# Patient Record
Sex: Male | Born: 1985 | Race: Asian | Hispanic: Yes | Marital: Single
Health system: Southern US, Community
[De-identification: ages and names within clinical notes are randomized; demographics above are authoritative.]

---

## 2019-04-11 ENCOUNTER — Emergency Department (HOSPITAL_COMMUNITY)
Admission: EM | Admit: 2019-04-11 | Discharge: 2019-04-11 | Disposition: A | Discharge status: Home / Self Care | Payer: Self-pay | Attending: Emergency Medicine | Admitting: Emergency Medicine

## 2019-04-11 ENCOUNTER — Encounter (HOSPITAL_COMMUNITY): Payer: Self-pay | Admitting: Emergency Medicine

## 2019-04-11 ENCOUNTER — Emergency Department (HOSPITAL_COMMUNITY): Payer: Self-pay

## 2019-04-11 ENCOUNTER — Other Ambulatory Visit: Payer: Self-pay

## 2019-04-11 DIAGNOSIS — N201 Calculus of ureter: Secondary | ICD-10-CM | POA: Insufficient documentation

## 2019-04-11 DIAGNOSIS — R109 Unspecified abdominal pain: Secondary | ICD-10-CM

## 2019-04-11 LAB — CBC WITH DIFFERENTIAL/PLATELET
Abs Immature Granulocytes: 0.02 10*3/uL (ref 0.00–0.07)
Basophils Absolute: 0 10*3/uL (ref 0.0–0.1)
Basophils Relative: 0 %
Eosinophils Absolute: 0.1 10*3/uL (ref 0.0–0.5)
Eosinophils Relative: 1 %
HCT: 43.2 % (ref 39.0–52.0)
Hemoglobin: 14.5 g/dL (ref 13.0–17.0)
Immature Granulocytes: 0 %
Lymphocytes Relative: 45 %
Lymphs Abs: 3.1 10*3/uL (ref 0.7–4.0)
MCH: 30.8 pg (ref 26.0–34.0)
MCHC: 33.6 g/dL (ref 30.0–36.0)
MCV: 91.7 fL (ref 80.0–100.0)
Monocytes Absolute: 0.4 10*3/uL (ref 0.1–1.0)
Monocytes Relative: 6 %
Neutro Abs: 3.2 10*3/uL (ref 1.7–7.7)
Neutrophils Relative %: 48 %
Platelets: 223 10*3/uL (ref 150–400)
RBC: 4.71 MIL/uL (ref 4.22–5.81)
RDW: 12.4 % (ref 11.5–15.5)
WBC: 6.8 10*3/uL (ref 4.0–10.5)
nRBC: 0 % (ref 0.0–0.2)

## 2019-04-11 LAB — COMPREHENSIVE METABOLIC PANEL
ALT: 20 U/L (ref 0–44)
AST: 18 U/L (ref 15–41)
Albumin: 4 g/dL (ref 3.5–5.0)
Alkaline Phosphatase: 54 U/L (ref 38–126)
Anion gap: 10 (ref 5–15)
BUN: 16 mg/dL (ref 6–20)
CO2: 24 mmol/L (ref 22–32)
Calcium: 9.3 mg/dL (ref 8.9–10.3)
Chloride: 104 mmol/L (ref 98–111)
Creatinine, Ser: 1.21 mg/dL (ref 0.61–1.24)
GFR calc Af Amer: >60 mL/min (ref >60)
GFR calc non Af Amer: >60 mL/min (ref >60)
Glucose, Bld: 148 mg/dL — ABNORMAL HIGH (ref 70–99)
Potassium: 3.6 mmol/L (ref 3.5–5.1)
Sodium: 138 mmol/L (ref 135–145)
Total Bilirubin: 0.8 mg/dL (ref 0.3–1.2)
Total Protein: 6.4 g/dL — ABNORMAL LOW (ref 6.5–8.1)

## 2019-04-11 LAB — LIPASE, BLOOD: Lipase: 26 U/L (ref 11–51)

## 2019-04-11 LAB — URINALYSIS, ROUTINE W REFLEX MICROSCOPIC
Bilirubin Urine: NEGATIVE
Glucose, UA: NEGATIVE mg/dL
Ketones, ur: NEGATIVE mg/dL
Leukocytes,Ua: NEGATIVE
Nitrite: NEGATIVE
Protein, ur: NEGATIVE mg/dL
Specific Gravity, Urine: 1.016 (ref 1.005–1.030)
pH: 7 (ref 5.0–8.0)

## 2019-04-11 MED ORDER — ONDANSETRON 4 MG PO TBDP: 4.0000 mg | ORAL_TABLET | Freq: Three times a day (TID) | ORAL | 0 refills | Status: AC | PRN

## 2019-04-11 MED ORDER — MORPHINE SULFATE (PF) 4 MG/ML IV SOLN
4.0000 mg | Freq: Once | INTRAVENOUS | Status: AC
  Administered 2019-04-11: 4 mg via INTRAVENOUS
  Filled 2019-04-11: qty 1

## 2019-04-11 MED ORDER — TAMSULOSIN HCL 0.4 MG PO CAPS: 0.4000 mg | ORAL_CAPSULE | Freq: Every day | ORAL | 0 refills | Status: AC

## 2019-04-11 MED ORDER — HYDROCODONE-ACETAMINOPHEN 5-325 MG PO TABS: 1.0000 | ORAL_TABLET | Freq: Four times a day (QID) | ORAL | 0 refills | Status: AC | PRN

## 2019-04-11 MED ORDER — FENTANYL CITRATE (PF) 100 MCG/2ML IJ SOLN
50.0000 ug | Freq: Once | INTRAMUSCULAR | Status: AC
  Administered 2019-04-11: 50 ug via INTRAVENOUS
  Filled 2019-04-11: qty 2

## 2019-04-11 MED ORDER — HYDROCODONE-ACETAMINOPHEN 5-325 MG PO TABS
2.0000 | ORAL_TABLET | Freq: Once | ORAL | Status: AC
  Administered 2019-04-11: 2 via ORAL
  Filled 2019-04-11: qty 2

## 2019-04-11 NOTE — ED Provider Notes (Addendum)
[facility] EMERGENCY DEPARTMENT Provider Note   CSN: [id number] Arrival date & time: 04/11/19  0818     History Chief Complaint  Patient presents with  . Flank Pain   History obtained without use of interpreter, patient fluent in [place]. [name] is a 34 y.o. male otherwise healthy no daily medication use.  Patient reports right flank pain onset 8 AM this morning while driving to work, no clear inciting event, describes severe throbbing ache constant nonradiating no clear aggravating or alleviating factors, no similar pain in the past.  Denies fall/injury, fever/chills, chest pain/shortness of breath, abdominal pain, nausea/vomiting, diarrhea, dysuria/hematuria, saddle paresthesias, bowel/bladder incontinence, testicular pain/swelling, numbness/weakness, tingling or any additional concerns.  HPI     History reviewed. No pertinent past medical history.  There are no problems to display for this patient.   History reviewed. No pertinent surgical history.     No family history on file.  Social History   Tobacco Use  . Smoking status: Not on file  Substance Use Topics  . Alcohol use: Not on file  . Drug use: Not on file    Home Medications Prior to Admission medications   Medication Sig Start Date End Date Taking? Authorizing Provider  HYDROcodone-acetaminophen (NORCO/VICODIN) 5-325 MG tablet Take 1-2 tablets by mouth every 6 (six) hours as needed. 04/11/19   Harlene Salts A, PA-C  ondansetron (ZOFRAN ODT) 4 MG disintegrating tablet Take 1 tablet (4 mg total) by mouth every 8 (eight) hours as needed for nausea or vomiting. 04/11/19   Harlene Salts A, PA-C  tamsulosin (FLOMAX) 0.4 MG CAPS capsule Take 1 capsule (0.4 mg total) by mouth daily. 04/11/19   Bill Salinas, PA-C    Allergies    Patient has no allergy information on record.  Review of Systems   Review of Systems Ten systems are reviewed and are negative for acute change except  as noted in the HPI  Physical Exam Updated Vital Signs BP 120/80 (BP Location: Right Arm)   Pulse 75   Temp 98.3 F (36.8 C) (Oral)   Resp 16   Ht 5\' 10"  (1.778 m)   Wt 74.8 kg   SpO2 100%   BMI 23.68 kg/m   Physical Exam Constitutional:      General: He is in acute distress (Uncomfortable, rocking side to side in bed holding right flank).     Appearance: Normal appearance. He is well-developed. He is not ill-appearing or diaphoretic.  HENT:     Head: Normocephalic and atraumatic.     Right Ear: External ear normal.     Left Ear: External ear normal.     Nose: Nose normal.  Eyes:     General: Vision grossly intact. Gaze aligned appropriately.     Pupils: Pupils are equal, round, and reactive to light.  Neck:     Trachea: Trachea and phonation normal. No tracheal deviation.  Cardiovascular:     Pulses:          Dorsalis pedis pulses are 2+ on the right side and 2+ on the left side.  Pulmonary:     Effort: Pulmonary effort is normal. No respiratory distress.  Abdominal:     General: There is no distension.     Palpations: Abdomen is soft.     Tenderness: There is no abdominal tenderness. There is right CVA tenderness. There is no left CVA tenderness, guarding or rebound.  Musculoskeletal:        General: Normal range of  motion.     Cervical back: Normal range of motion.  Feet:     Right foot:     Protective Sensation: 3 sites tested. 3 sites sensed.     Left foot:     Protective Sensation: 3 sites tested. 3 sites sensed.  Skin:    General: Skin is warm and dry.  Neurological:     Mental Status: He is alert.     GCS: GCS eye subscore is 4. GCS verbal subscore is 5. GCS motor subscore is 6.     Comments: Speech is clear and goal oriented, follows commands Major Cranial nerves without deficit, no facial droop Moves extremities without ataxia, coordination intact  Psychiatric:        Behavior: Behavior normal.     ED Results / Procedures / Treatments   Labs (all  labs ordered are listed, but only abnormal results are displayed) Labs Reviewed  COMPREHENSIVE METABOLIC PANEL - Abnormal; Notable for the following components:      Result Value   Glucose, Bld 148 (*)    Total Protein 6.4 (*)    All other components within normal limits  URINALYSIS, ROUTINE W REFLEX MICROSCOPIC - Abnormal; Notable for the following components:   Hgb urine dipstick MODERATE (*)    Bacteria, UA RARE (*)    All other components within normal limits  URINE CULTURE  CBC WITH DIFFERENTIAL/PLATELET  LIPASE, BLOOD    EKG None  Radiology CT RENAL STONE STUDY  Result Date: 04/11/2019 CLINICAL DATA:  Right flank pain. EXAM: CT ABDOMEN AND PELVIS WITHOUT CONTRAST TECHNIQUE: Multidetector CT imaging of the abdomen and pelvis was performed following the standard protocol without IV contrast. COMPARISON:  None. FINDINGS: Lower chest: The lung bases are clear of acute process. No pleural effusion or pulmonary lesions. The heart is normal in size. No pericardial effusion. The distal esophagus and aorta are unremarkable. Hepatobiliary: No focal hepatic lesions or intrahepatic biliary dilatation. The gallbladder is unremarkable. No common bile duct dilatation. Pancreas: No mass, inflammation or ductal dilatation. Spleen: Normal size. No focal lesions. Adrenals/Urinary Tract: The adrenal glands are normal. No renal calculi are identified. There is mild distal right ureteral dilatation and a 3.5 mm right UVJ calculus. No significant hydronephrosis. No left ureteral calculi. No worrisome renal or bladder lesions without contrast. Stomach/Bowel: The stomach, duodenum, small bowel and colon are grossly normal without oral contrast. No inflammatory changes, mass lesions or obstructive findings. The appendix is normal. Vascular/Lymphatic: The aorta is normal in caliber. No dissection. The branch vessels are patent. The major venous structures are patent. No mesenteric or retroperitoneal mass or  adenopathy. Small scattered lymph nodes are noted. Reproductive: The prostate gland and seminal vesicles are unremarkable Other: No pelvic mass or adenopathy. No free pelvic fluid collections. No inguinal mass or adenopathy. No abdominal wall hernia or subcutaneous lesions. Musculoskeletal: No significant bony findings. IMPRESSION: 1. 3.5 mm right UVJ calculus with mild distal right ureteral dilatation. 2. No renal calculi or worrisome renal or bladder lesions without contrast. 3. No other significant abdominal/pelvic findings, mass lesions or adenopathy. Electronically Signed   By: Marijo Sanes M.D.   On: 04/11/2019 09:34    Procedures Procedures (including critical care time)  Medications Ordered in ED Medications  HYDROcodone-acetaminophen (NORCO/VICODIN) 5-325 MG per tablet 2 tablet (has no administration in time range)  fentaNYL (SUBLIMAZE) injection 50 mcg (50 mcg Intravenous Given 04/11/19 0856)  morphine 4 MG/ML injection 4 mg (4 mg Intravenous Given 04/11/19 1148)  ED Course  I have reviewed the triage vital signs and the nursing notes.  Pertinent labs & imaging results that were available during my care of the patient were reviewed by me and considered in my medical decision making (see chart for details).    MDM Rules/Calculators/A&P                     This patient complains of right flank pain, this involves an extensive number of treatment options, and is a complaint that carries with it a high risk of complications and morbidity.  The differential diagnosis includes stone disease, dissection, pyelonephritis, musculoskeletal pain, testicular torsion, intra-abdominal infection.  I Ordered, reviewed, and interpreted labs, which included CBC, CMP, lipase, urinalysis.  CBC shows no evidence of anemia and no leukocytosis to suggest infection, CMP shows no emergent electrolyte pathology, evidence of kidney injury or elevation of LFTs, lipase within normal limits no evidence of  pancreatitis, urinalysis shows moderate hemoglobin rare bacteria consistent with kidney stone, no evidence of infection. I ordered medication fentanyl followed by morphine for pain.  I ordered imaging study which included CT renal stone study. I independently visualized and interpreted imaging which showed right-sided kidney stone at the UVJ.  Chart reviewed no previous visits. - After the interventions stated above, I reevaluated the patient and found him resting comfortably in bed no acute distress reports improvement of pain.  He stated understanding of findings and has no questions.  He is agreeable to discharge with pain medication, nausea medication and Flomax.  He states understanding of narcotic precautions and has no further questions.  PDMP reviewed patient without previous narcotic prescriptions, feel reasonable to prescribe patient 4 pills Norco at this time for acute pain secondary to kidney stone.  Patient has a ride home from ER.  At this time there does not appear to be any evidence of an acute emergency medical condition and the patient appears stable for discharge with appropriate outpatient follow up. Diagnosis was discussed with patient who verbalizes understanding of care plan and is agreeable to discharge. I have discussed return precautions with patient who verbalizes understanding of return precautions. Patient encouraged to follow-up with their PCP and urology. All questions answered.  Patient's case discussed with Dr. Anitra Lauth who agrees with plan to discharge with follow-up.   Note: Portions of this report may have been transcribed using voice recognition software. Every effort was made to ensure accuracy; however, inadvertent computerized transcription errors may still be present. Final Clinical Impression(s) / ED Diagnoses Final diagnoses:  Right flank pain  Ureteral calculi    Rx / DC Orders ED Discharge Orders         Ordered    tamsulosin (FLOMAX) 0.4 MG CAPS capsule   Daily     04/11/19 1300    ondansetron (ZOFRAN ODT) 4 MG disintegrating tablet  Every 8 hours PRN     04/11/19 1300    HYDROcodone-acetaminophen (NORCO/VICODIN) 5-325 MG tablet  Every 6 hours PRN     04/11/19 1300           Bill Salinas, PA-C 04/11/19 1302    Bill Salinas, PA-C 04/11/19 1302    Gwyneth Sprout, MD 04/12/19 0730

## 2019-04-11 NOTE — ED Notes (Signed)
Pt aware of urine sample needed. Pt staes he is unable to provide at this time

## 2019-04-11 NOTE — Discharge Instructions (Addendum)
You have been diagnosed today with Kidney Stone  At this time there does not appear to be the presence of an emergent medical condition, however there is always the potential for conditions to change. Please read and follow the below instructions.  Please return to the Emergency Department immediately for any new or worsening symptoms. Please be sure to follow up with your Primary Care Provider within one week regarding your visit today; please call their office to schedule an appointment even if you are feeling better for a follow-up visit. Please call the urologist Dr. Laverle Patter on your discharge paperwork to schedule follow-up appointment regarding your kidney stone. Please take the medication Flomax as prescribed to help facilitate passage of your kidney stone. You may use the medication Zofran as prescribed to help with nausea.  This medication will dissolve under your tongue so do not swallow it. You may use the medication Norco as prescribed for severe pain.  You may not drive or perform any dangerous activities after taking Norco as it will make you drowsy.  Do not drink any alcohol or take any other sedating medications with Norco as this will worsen side effects. You were given pain medication in the ER today, do not drive or perform any dangerous activities for the rest the day as you will be drowsy.   Please read the additional information packets attached to your discharge summary.  Do not take your medicine if  develop an itchy rash, swelling in your mouth or lips, or difficulty breathing; call 911 and seek immediate emergency medical attention if this occurs.  Note: Portions of this text may have been transcribed using voice recognition software. Every effort was made to ensure accuracy; however, inadvertent computerized transcription errors may still be present.

## 2019-04-11 NOTE — ED Triage Notes (Signed)
Pt endorses right sided flank pain starting about 8 while driving to work. Pt unable to sit still in triage due to pain. Denies N/V. Able to eat breakfast with no issues.

## 2019-04-12 LAB — URINE CULTURE: Culture: <10000 — AB

## 2021-07-15 IMAGING — CT CT RENAL STONE PROTOCOL
2 of 4 series · 16 of 46 positions shown, 18 images · non-contrast
Comparison: None.

CLINICAL DATA: Right flank pain.

EXAM:
CT ABDOMEN AND PELVIS WITHOUT CONTRAST
TECHNIQUE: Multidetector CT imaging of the abdomen and pelvis was performed
following the standard protocol without IV contrast.

[Series 3: renal stone 5.0 · axial · 0.69mm/px · z∈[+628,+1053]mm · 13 of 93 slices shown, 15 images]
[im 4/93  soft-tissue]
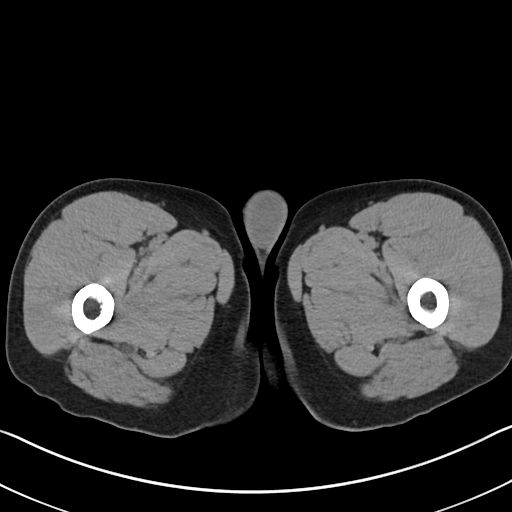
[im 4/93  bone]
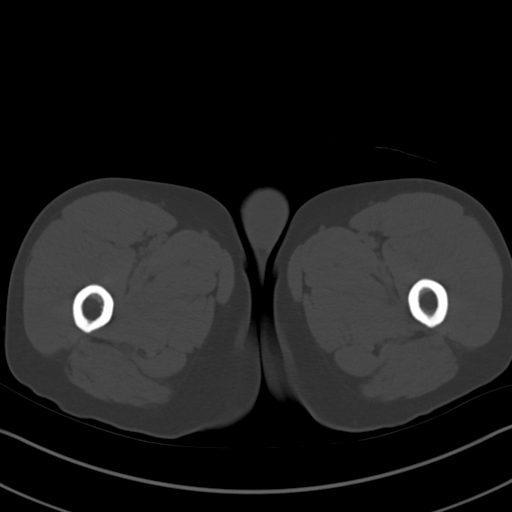
[im 12/93  soft-tissue]
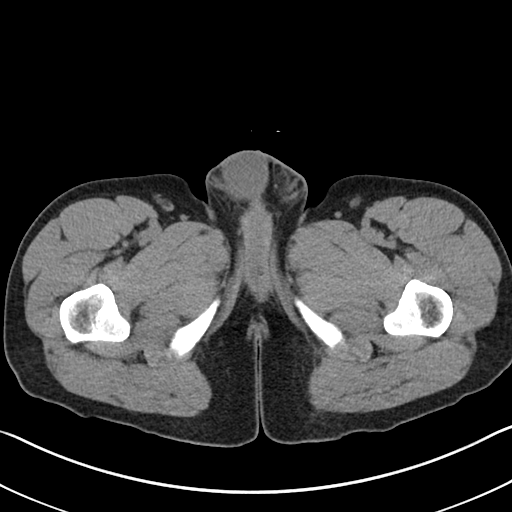
[im 19/93  soft-tissue]
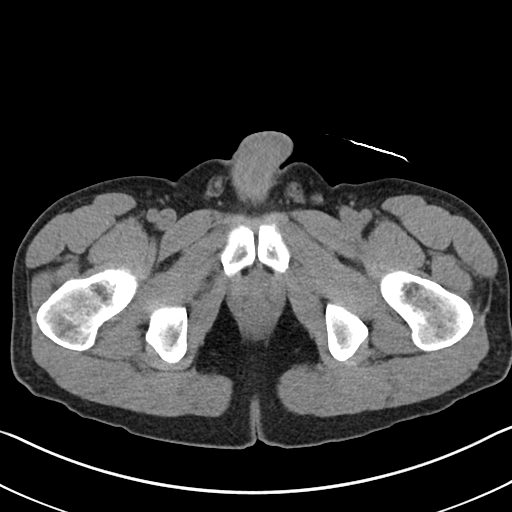
[im 26/93  soft-tissue]
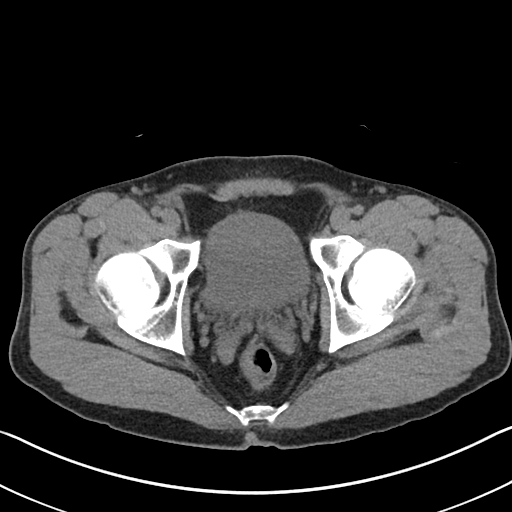
[im 34/93  soft-tissue]
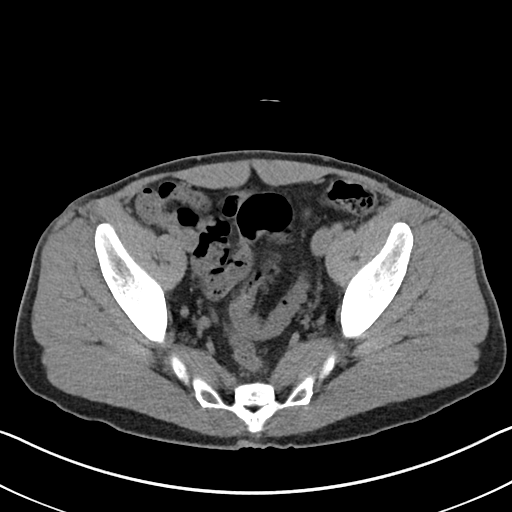
[im 41/93  soft-tissue]
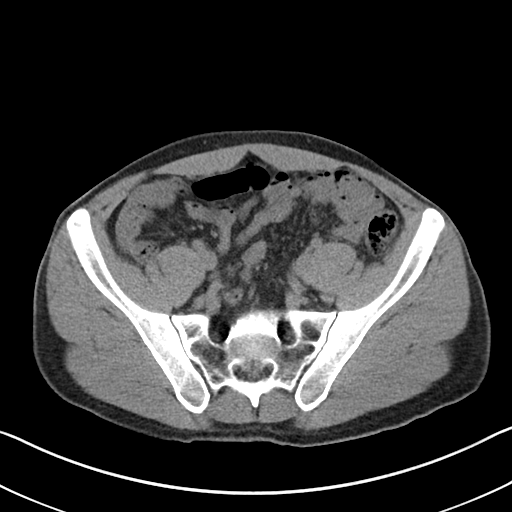
[im 48/93  soft-tissue]
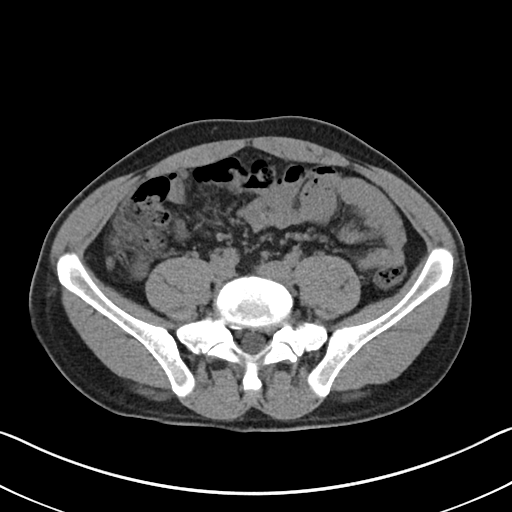
[im 52/93  soft-tissue]
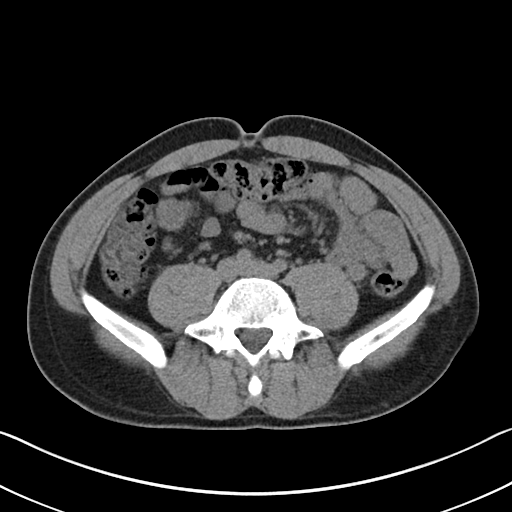
[im 59/93  soft-tissue]
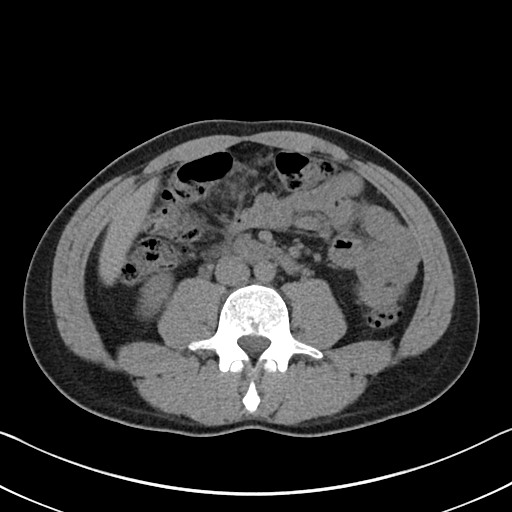
[im 59/93  bone]
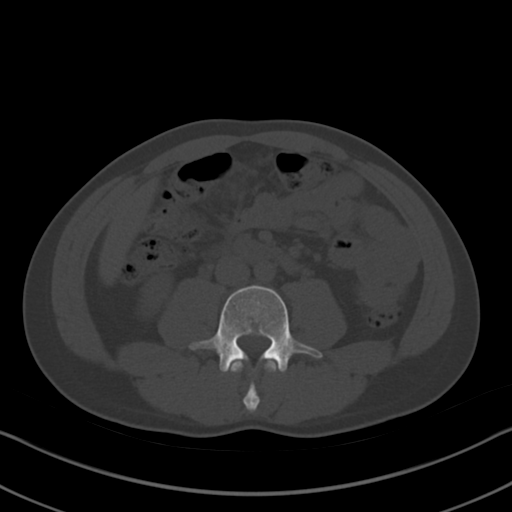
[im 67/93  soft-tissue]
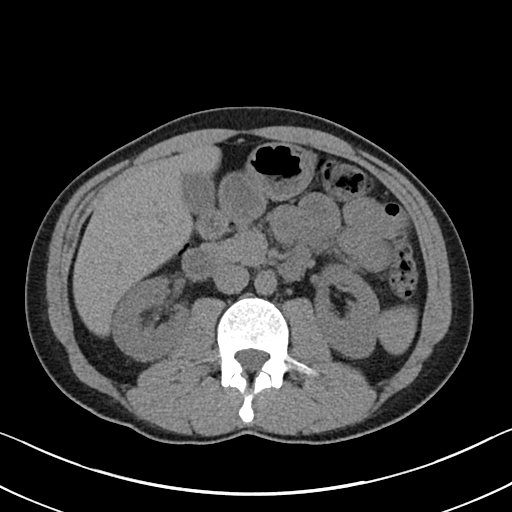
[im 74/93  soft-tissue]
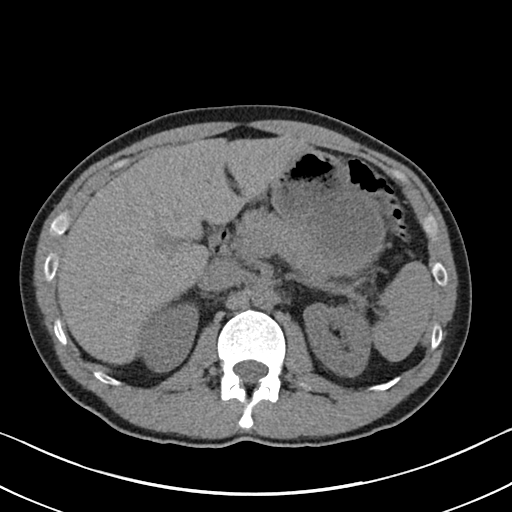
[im 81/93  soft-tissue]
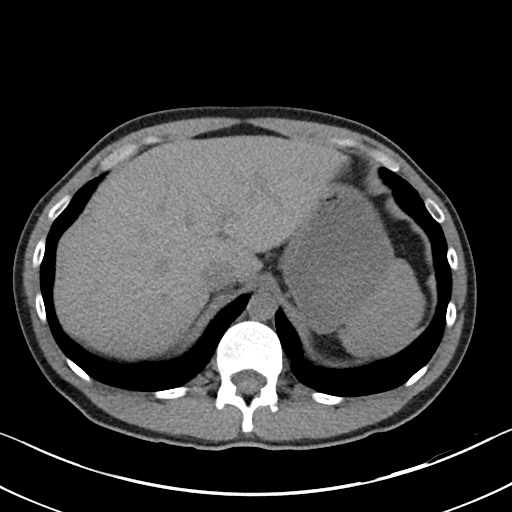
[im 89/93  soft-tissue]
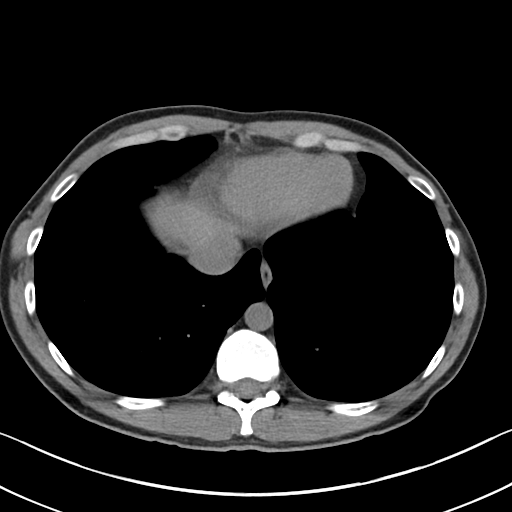

[Series 6: coronal · coronal · 0.79mm/px · 3 of 85 slices shown]
[im 29/85  soft-tissue]
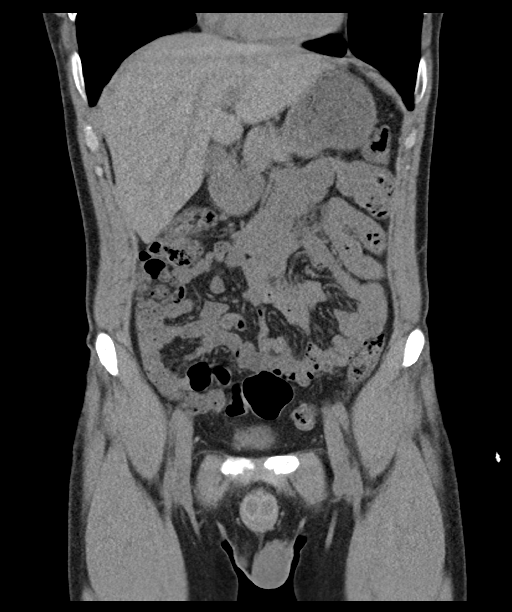
[im 38/85  soft-tissue]
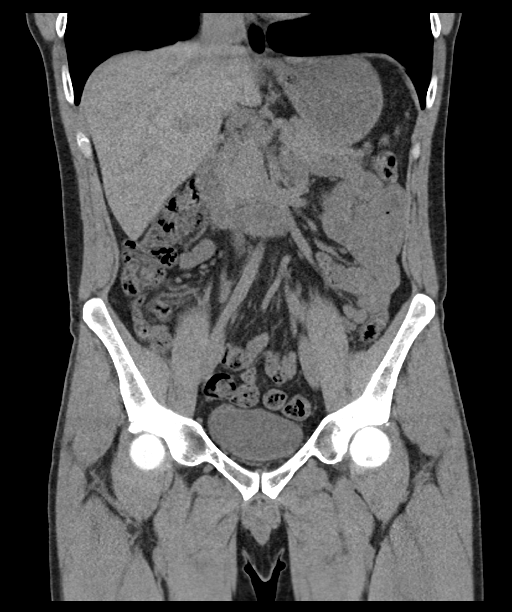
[im 47/85  soft-tissue]
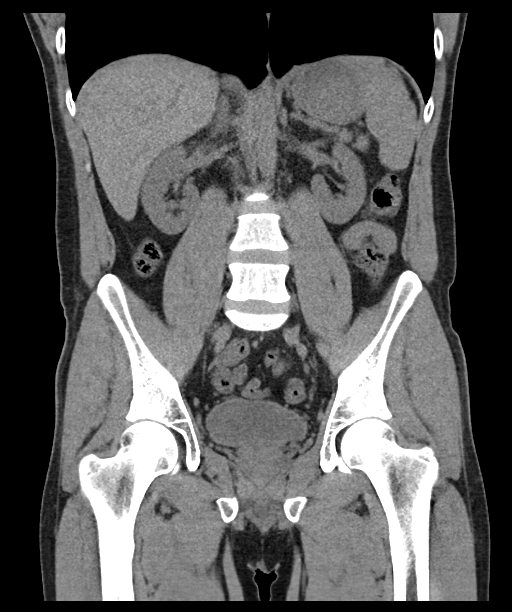

[16 of 46 positions shown; findings below may reference images not displayed]

FINDINGS: Lower chest: The lung bases are clear of acute process. No pleural
effusion or pulmonary lesions. The heart is normal in size. No
pericardial effusion. The distal esophagus and aorta are
unremarkable.

Hepatobiliary: No focal hepatic lesions or intrahepatic biliary
dilatation. The gallbladder is unremarkable. No common bile duct
dilatation.

Pancreas: No mass, inflammation or ductal dilatation.

Spleen: Normal size. No focal lesions.

Adrenals/Urinary Tract: The adrenal glands are normal.

No renal calculi are identified. There is mild distal right ureteral
dilatation and a 3.5 mm right UVJ calculus. No significant
hydronephrosis. No left ureteral calculi.

No worrisome renal or bladder lesions without contrast.

Stomach/Bowel: The stomach, duodenum, small bowel and colon are
grossly normal without oral contrast. No inflammatory changes, mass
lesions or obstructive findings. The appendix is normal.

Vascular/Lymphatic: The aorta is normal in caliber. No dissection.
The branch vessels are patent. The major venous structures are
patent. No mesenteric or retroperitoneal mass or adenopathy. Small
scattered lymph nodes are noted.

Reproductive: The prostate gland and seminal vesicles are
unremarkable

Other: No pelvic mass or adenopathy. No free pelvic fluid
collections. No inguinal mass or adenopathy. No abdominal wall
hernia or subcutaneous lesions.

Musculoskeletal: No significant bony findings.
IMPRESSION: 1. 3.5 mm right UVJ calculus with mild distal right ureteral
dilatation.
2. No renal calculi or worrisome renal or bladder lesions without
contrast.
3. No other significant abdominal/pelvic findings, mass lesions or
adenopathy.
